# Patient Record
Sex: Male | Born: 2000 | Hispanic: Yes | Marital: Single | Smoking: Never smoker
Health system: Southern US, Community
[De-identification: ages and names within clinical notes are randomized; demographics above are authoritative.]

## PROBLEM LIST (undated history)

## (undated) DIAGNOSIS — J45909 Unspecified asthma, uncomplicated: Secondary | ICD-10-CM

---
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
## 2023-04-18 ENCOUNTER — Ambulatory Visit (INDEPENDENT_AMBULATORY_CARE_PROVIDER_SITE_OTHER): Payer: Medicaid Other | Admitting: Physician Assistant

## 2023-04-18 ENCOUNTER — Encounter: Payer: Self-pay | Admitting: Physician Assistant

## 2023-04-18 VITALS — BP 100/64 | HR 74 | Temp 98.1°F | Ht 67.0 in | Wt 129.0 lb

## 2023-04-18 DIAGNOSIS — F419 Anxiety disorder, unspecified: Secondary | ICD-10-CM | POA: Insufficient documentation

## 2023-04-18 DIAGNOSIS — Z7689 Persons encountering health services in other specified circumstances: Secondary | ICD-10-CM

## 2023-04-18 DIAGNOSIS — Z1321 Encounter for screening for nutritional disorder: Secondary | ICD-10-CM

## 2023-04-18 DIAGNOSIS — Z0000 Encounter for general adult medical examination without abnormal findings: Secondary | ICD-10-CM

## 2023-04-18 DIAGNOSIS — Z91018 Allergy to other foods: Secondary | ICD-10-CM

## 2023-04-18 DIAGNOSIS — Z1159 Encounter for screening for other viral diseases: Secondary | ICD-10-CM

## 2023-04-18 DIAGNOSIS — Z114 Encounter for screening for human immunodeficiency virus [HIV]: Secondary | ICD-10-CM

## 2023-04-18 DIAGNOSIS — F322 Major depressive disorder, single episode, severe without psychotic features: Secondary | ICD-10-CM | POA: Insufficient documentation

## 2023-04-18 DIAGNOSIS — Z23 Encounter for immunization: Secondary | ICD-10-CM | POA: Diagnosis not present

## 2023-04-18 DIAGNOSIS — J4520 Mild intermittent asthma, uncomplicated: Secondary | ICD-10-CM

## 2023-04-18 MED ORDER — HYDROXYZINE HCL 25 MG PO TABS: 25.0000 mg | ORAL_TABLET | Freq: Three times a day (TID) | ORAL | 1 refills | Status: DC | PRN

## 2023-04-18 MED ORDER — EPINEPHRINE 0.3 MG/0.3ML IJ SOAJ: 0.3000 mg | INTRAMUSCULAR | 3 refills | Status: AC | PRN

## 2023-04-18 MED ORDER — VENTOLIN HFA 108 (90 BASE) MCG/ACT IN AERS: 2.0000 | INHALATION_SPRAY | Freq: Four times a day (QID) | RESPIRATORY_TRACT | 5 refills | Status: AC | PRN

## 2023-04-18 NOTE — Patient Instructions (Signed)

## 2023-04-18 NOTE — Progress Notes (Signed)
Date:  04/18/2023   Name:  Joseph Ramos   DOB:  2001-02-08   MRN:  [id number]   Chief Complaint: Establish Care, Allergies (Wants a epi pen due to peanut allergy ), and Anxiety  HPI Joseph Ramos is a very pleasant 22 y.o. male in good health new to the clinic today to establish care and for routine physical. He has never seen a PCP as an adult. He is joined by his wife Sue Lush who was present for part of our visit today, but I requested to leave the room for discussion of depression/anxiety.   Lorence has been struggling with job transition recently. 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 Sleep is poor, averaging probably 4 hours per night. Endorses anhedonia, decreased appetite, and low libido. Previously used the basketball court as an outlet but has not been since he injured his knee 2 months ago.  Thankfully, his old boss has offered him a new job opportunity, and he is contemplating taking it but needs to decide by Monday.   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   Has asthma well-controlled with inhaler, but needs refill.   Medication list has been reviewed and updated.  Current Meds  Medication Sig   EPINEPHrine 0.3 mg/0.3 mL IJ SOAJ injection Inject 0.3 mg into the muscle as needed for anaphylaxis.   hydrOXYzine (ATARAX) 25 MG tablet Take 1 tablet (25 mg total) by mouth 3 (three) times daily as needed.   [DISCONTINUED] VENTOLIN HFA 108 (90 Base) MCG/ACT inhaler SMARTSIG:2 Puff(s) By Mouth Every 6 Hours PRN     Review of Systems  Constitutional:  Positive for appetite change and fatigue. Negative for activity change and unexpected weight change.  HENT:  Negative  for dental problem, hearing loss and trouble swallowing.   Eyes:  Negative for visual disturbance.  Respiratory:  Negative for cough, chest tightness, shortness of breath and wheezing.   Cardiovascular:  Negative for chest pain, palpitations and leg swelling.  Gastrointestinal:  Negative for abdominal distention, abdominal pain, blood in stool, constipation and diarrhea.  Endocrine: Negative for polydipsia, polyphagia and polyuria.  Genitourinary:  Negative for difficulty urinating, dysuria, enuresis, frequency, hematuria and testicular pain.  Musculoskeletal:  Negative for arthralgias, gait problem and joint swelling.  Skin:  Negative for rash and wound.  Neurological:  Negative for dizziness, syncope, weakness and headaches.  Psychiatric/Behavioral:  Positive for decreased concentration, dysphoric mood, sleep disturbance and suicidal ideas. Negative for behavioral problems and self-injury. The patient is nervous/anxious.     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 No pertinent surgical history.  Social History   Tobacco Use   Smoking status: Never   Smokeless tobacco: Never  Vaping Use   Vaping status: Never Used  Substance Use Topics   Alcohol use:  No   Drug use: Not Currently    Types: Marijuana    History reviewed. No pertinent family history.      04/18/2023    4:27 PM 04/18/2023    3:10 PM  GAD 7 : Generalized Anxiety Score  Nervous, Anxious, on Edge 3 3  Control/stop worrying 3 3  Worry too much - different things 3 3  Trouble relaxing 3 3  Restless 3 3  Easily annoyed or  irritable 3 3  Afraid - awful might happen 3 2  Total GAD 7 Score 21 20  Anxiety Difficulty Somewhat difficult Somewhat difficult       04/18/2023    3:10 PM  Depression screen PHQ 2/9  Decreased Interest 2  Down, Depressed, Hopeless 2  PHQ - 2 Score 4  Altered sleeping 2  Tired, decreased energy 2  Change in appetite 2  Feeling bad or failure about yourself  1  Trouble concentrating 2  Moving slowly or fidgety/restless 2  Suicidal thoughts 0  PHQ-9 Score 15  Difficult doing work/chores Somewhat difficult    BP Readings from Last 3 Encounters:  04/18/23 100/64  05/29/18 (!) 107/55  10/07/17 115/66    Wt Readings from Last 3 Encounters:  04/18/23 129 lb (58.5 kg)  05/29/18 124 lb 5.4 oz (56.4 kg) (16%, Z= -1.00)*  10/07/17 124 lb 5.4 oz (56.4 kg) (22%, Z= -0.77)*   * Growth percentiles are based on CDC (Boys, 2-20 Years) data.    BP 100/64   Pulse 74   Temp 98.1 F (36.7 C) (Oral)   Ht 5\' 7"  (1.702 m)   Wt 129 lb (58.5 kg)   SpO2 97%   BMI 20.20 kg/m   Physical Exam  Recent Labs     Component Value Date/Time   NA 139 05/29/2018 1417   K 3.8 05/29/2018 1417   CL 101 05/29/2018 1417   CO2 29 05/29/2018 1417   GLUCOSE 113 (H) 05/29/2018 1417   BUN 14 05/29/2018 1417   CREATININE 0.65 05/29/2018 1417   CALCIUM 9.4 05/29/2018 1417   PROT 7.0 05/29/2018 1417   ALBUMIN 4.2 05/29/2018 1417   AST 20 05/29/2018 1417   ALT 22 05/29/2018 1417   ALKPHOS 79 05/29/2018 1417   BILITOT 1.7 (H) 05/29/2018 1417   GFRNONAA NOT CALCULATED 05/29/2018 1417   GFRAA NOT CALCULATED 05/29/2018 1417    Lab Results  Component Value Date   WBC 6.6 05/29/2018   HGB 15.9 05/29/2018   HCT 44.7 05/29/2018   MCV 86.6 05/29/2018   PLT 220 05/29/2018   No results found for: "HGBA1C" No results found for: "CHOL", "HDL", "LDLCALC", "LDLDIRECT", "TRIG", "CHOLHDL" No results found for: "TSH"   Assessment and Plan:  1. Annual physical exam Physically healthy patient with no  abnormalities on exam. Encouraged healthy lifestyle including regular physical activity and consumption of whole fruits and vegetables.  Checking baseline labs today  - CBC with Differential/Platelet - Comprehensive metabolic panel - TSH  2. Encounter to establish care Happy to welcome this patient to the practice.  No records for me to review at this time.  3. Current severe episode of major depressive disorder without psychotic features without prior episode [facility]) Discussed with patient at length today.  Seems to be acutely exacerbated by recent job transition, around which most of his symptoms are focused.  Options include pharmacotherapy, psychiatry, counseling.  Patient does have a family member (other uncle) and whom he can confide and plans to speak  with him over the weekend.  Hopefully with a potential job change, his mood symptoms will greatly improve.  For now, we will trial hydroxyzine which should at the very least help with anxiety and sleep.  Offered referral to behavioral health, but deferred at this time.  Encouraged patient get back on the basketball court this weekend, but with limited intensity so as not to re-injure the knee. Suggested he just take shots on the hoop casually.   4. Anxiety Sending hydroxyzine for nightly and/or PRN use.  - hydrOXYzine (ATARAX) 25 MG tablet; Take 1 tablet (25 mg total) by mouth 3 (three) times daily as needed.  Dispense: 30 tablet; Refill: 1  5. Mild intermittent asthma without complication Refill Ventolin as below - VENTOLIN HFA 108 (90 Base) MCG/ACT inhaler; Inhale 2 puffs into the lungs every 6 (six) hours as needed for wheezing or shortness of breath.  Dispense: 1 each; Refill: 5  6. History of food anaphylaxis Refill EpiPen as below - EPINEPHrine 0.3 mg/0.3 mL IJ SOAJ injection; Inject 0.3 mg into the muscle as needed for anaphylaxis.  Dispense: 1 each; Refill: 3  7. Need for HPV vaccination Gardasil #1 administered today, will need  3 dose series - HPV 9-valent vaccine,Recombinat  8. Need for Tdap vaccination Tdap booster administered today - Tdap vaccine greater than or equal to 7yo IM  9. Encounter for vitamin deficiency screening Screen for vitamin D deficiency in the context of depression - VITAMIN D 25 Hydroxy (Vit-D Deficiency, Fractures)  10. Need for hepatitis C screening test One-time hep C screen today - Hepatitis C antibody  11. Screening for HIV (human immunodeficiency virus) One-time HIV screen today - HIV Antibody (routine testing w rflx)   Return in about 4 weeks (around 05/16/2023) for OV f/u dep/anx.    Alvester Morin, PA-C, DMSc, Nutritionist [facility] Primary Care and Sports Medicine [facility] [phone]

## 2023-04-19 LAB — CBC WITH DIFFERENTIAL/PLATELET
Basophils Absolute: 0.1 10*3/uL (ref 0.0–0.2)
Basos: 1 %
EOS (ABSOLUTE): 0.2 10*3/uL (ref 0.0–0.4)
Eos: 3 %
Hematocrit: 46.5 % (ref 37.5–51.0)
Hemoglobin: 15.8 g/dL (ref 13.0–17.7)
Immature Grans (Abs): 0 10*3/uL (ref 0.0–0.1)
Immature Granulocytes: 0 %
Lymphocytes Absolute: 1.1 10*3/uL (ref 0.7–3.1)
Lymphs: 21 %
MCH: 29.8 pg (ref 26.6–33.0)
MCHC: 34 g/dL (ref 31.5–35.7)
MCV: 88 fL (ref 79–97)
Monocytes Absolute: 0.4 10*3/uL (ref 0.1–0.9)
Monocytes: 7 %
Neutrophils Absolute: 3.6 10*3/uL (ref 1.4–7.0)
Neutrophils: 68 %
Platelets: 266 10*3/uL (ref 150–450)
RBC: 5.31 x10E6/uL (ref 4.14–5.80)
RDW: 11.7 % (ref 11.6–15.4)
WBC: 5.3 10*3/uL (ref 3.4–10.8)

## 2023-04-19 LAB — COMPREHENSIVE METABOLIC PANEL
ALT: 11 IU/L (ref 0–44)
AST: 15 IU/L (ref 0–40)
Albumin: 4.8 g/dL (ref 4.3–5.2)
Alkaline Phosphatase: 67 IU/L (ref 44–121)
BUN/Creatinine Ratio: 18 (ref 9–20)
BUN: 18 mg/dL (ref 6–20)
Bilirubin Total: 1.1 mg/dL (ref 0.0–1.2)
CO2: 22 mmol/L (ref 20–29)
Calcium: 9.7 mg/dL (ref 8.7–10.2)
Chloride: 101 mmol/L (ref 96–106)
Creatinine, Ser: 1 mg/dL (ref 0.76–1.27)
Globulin, Total: 2.2 g/dL (ref 1.5–4.5)
Glucose: 140 mg/dL — ABNORMAL HIGH (ref 70–99)
Potassium: 4.2 mmol/L (ref 3.5–5.2)
Sodium: 138 mmol/L (ref 134–144)
Total Protein: 7 g/dL (ref 6.0–8.5)
eGFR: 109 mL/min/{1.73_m2} (ref >59)

## 2023-04-19 LAB — HEPATITIS C ANTIBODY: Hep C Virus Ab: NONREACTIVE

## 2023-04-19 LAB — VITAMIN D 25 HYDROXY (VIT D DEFICIENCY, FRACTURES): Vit D, 25-Hydroxy: 25.1 ng/mL — ABNORMAL LOW (ref 30.0–100.0)

## 2023-04-19 LAB — HIV ANTIBODY (ROUTINE TESTING W REFLEX): HIV Screen 4th Generation wRfx: NONREACTIVE

## 2023-04-19 LAB — TSH: TSH: 1.25 u[IU]/mL (ref 0.450–4.500)

## 2023-04-21 ENCOUNTER — Other Ambulatory Visit: Payer: Self-pay | Admitting: Physician Assistant

## 2023-04-21 ENCOUNTER — Telehealth: Payer: Self-pay | Admitting: Physician Assistant

## 2023-04-21 DIAGNOSIS — E559 Vitamin D deficiency, unspecified: Secondary | ICD-10-CM

## 2023-04-21 MED ORDER — CHOLECALCIFEROL 1.25 MG (50000 UT) PO CAPS: 50000.0000 [IU] | ORAL_CAPSULE | ORAL | 0 refills | Status: AC

## 2023-04-21 NOTE — Telephone Encounter (Signed)
Copied from CRM [id number]. Topic: General - Inquiry >> Apr 21, 2023  9:33 AM Marlow Baars wrote: Reason for CRM: The patient called in stating he drank two sodas before he had his blood work done on Friday and doesn't know if this could be the reason for the elevated sugar levels. Please assist patient further.

## 2023-04-21 NOTE — Progress Notes (Signed)
Labs added [id number], R73.09.  KP

## 2023-04-22 LAB — SPECIMEN STATUS REPORT

## 2023-04-22 LAB — HGB A1C W/O EAG: Hgb A1c MFr Bld: 5 % (ref 4.8–5.6)

## 2023-04-22 NOTE — Telephone Encounter (Signed)
Noted we will call pt with results for his A1C when we get the results.  KP

## 2023-05-19 ENCOUNTER — Ambulatory Visit: Payer: Medicaid Other | Admitting: Physician Assistant

## 2023-06-13 ENCOUNTER — Encounter: Payer: Self-pay | Admitting: Physician Assistant

## 2023-06-13 ENCOUNTER — Ambulatory Visit (INDEPENDENT_AMBULATORY_CARE_PROVIDER_SITE_OTHER): Payer: Self-pay | Admitting: Physician Assistant

## 2023-06-13 VITALS — BP 108/68 | HR 78 | Temp 98.1°F | Ht 67.0 in | Wt 131.0 lb

## 2023-06-13 DIAGNOSIS — F411 Generalized anxiety disorder: Secondary | ICD-10-CM

## 2023-06-13 DIAGNOSIS — F322 Major depressive disorder, single episode, severe without psychotic features: Secondary | ICD-10-CM

## 2023-06-13 DIAGNOSIS — E559 Vitamin D deficiency, unspecified: Secondary | ICD-10-CM | POA: Insufficient documentation

## 2023-06-13 DIAGNOSIS — F410 Panic disorder [episodic paroxysmal anxiety] without agoraphobia: Secondary | ICD-10-CM

## 2023-06-13 DIAGNOSIS — Z23 Encounter for immunization: Secondary | ICD-10-CM

## 2023-06-13 MED ORDER — ALPRAZOLAM 0.25 MG PO TABS
0.2500 mg | ORAL_TABLET | Freq: Two times a day (BID) | ORAL | 1 refills | Status: DC | PRN
Start: 2023-06-13 — End: 2023-09-19

## 2023-06-13 NOTE — Progress Notes (Signed)
Date:  06/13/2023   Name:  Joseph Ramos   DOB:  2001-05-26   MRN:  [id number]   Chief Complaint: anxiety and depression   HPI Joseph Ramos returns for 24-month follow-up on anxiety and depression after last visit with me 04/18/2023.  He was found to have vitamin D insufficiency and has been supplementing with high-dose vitamin D supplement once weekly with good compliance.  Since her last visit he reports overall improvement in his quality of life and work situation.  He ended up taking a better job and feels like he is doing very well in his new role, optimistic about his future.  Feels stable and supported in his current romantic relationship.  He does tell me that he has been having episodes of acute anxiety, palpitations, chest tightness, and feelings of dread that typically last about 10 minutes.  These episodes seem to be entirely random, not associated with exertion, not improved with inhaler.  Last visit he was prescribed hydroxyzine for as needed use but he states this did not really help anything.   Medication list has been reviewed and updated.  Current Meds  Medication Sig   Cholecalciferol 1.25 MG (50000 UT) capsule Take 1 capsule (50,000 Units total) by mouth once a week for 12 doses.   EPINEPHrine 0.3 mg/0.3 mL IJ SOAJ injection Inject 0.3 mg into the muscle as needed for anaphylaxis.   hydrOXYzine (ATARAX) 25 MG tablet Take 1 tablet (25 mg total) by mouth 3 (three) times daily as needed.   VENTOLIN HFA 108 (90 Base) MCG/ACT inhaler Inhale 2 puffs into the lungs every 6 (six) hours as needed for wheezing or shortness of breath.     Review of Systems  Constitutional:  Positive for fatigue. Negative for fever.  Respiratory:  Positive for chest tightness. Negative for shortness of breath.   Cardiovascular:  Negative for chest pain and palpitations.  Gastrointestinal:  Negative for abdominal pain.  Psychiatric/Behavioral:  Positive for decreased concentration and  dysphoric mood. Negative for self-injury and suicidal ideas. The patient is nervous/anxious.     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 No pertinent surgical history.  Social History   Tobacco Use   Smoking status: Never   Smokeless tobacco: Never  Vaping Use   Vaping status: Never Used  Substance Use Topics   Alcohol use: No   Drug use: Not Currently    Types: Marijuana    History reviewed. No pertinent family history.      06/13/2023    1:31 PM 04/18/2023    4:27 PM 04/18/2023    3:10 PM  GAD 7 : Generalized Anxiety Score  Nervous, Anxious, on Edge 2 3 3   Control/stop worrying 2 3 3   Worry too much - different things 2 3 3   Trouble relaxing 2 3 3   Restless 2 3 3   Easily annoyed or irritable 1 3 3   Afraid - awful might happen 0 3 2  Total GAD 7 Score 11 21 20   Anxiety Difficulty Somewhat difficult Somewhat difficult Somewhat difficult       06/13/2023  1:30 PM 04/18/2023    3:10 PM  Depression screen PHQ 2/9  Decreased Interest 2 2  Down, Depressed, Hopeless 2 2  PHQ - 2 Score 4 4  Altered sleeping 2 2  Tired, decreased energy 2 2  Change in appetite 2 2  Feeling bad or failure about yourself  2 1  Trouble concentrating 2 2  Moving slowly or fidgety/restless 2 2  Suicidal thoughts 0 0  PHQ-9 Score 16 15  Difficult doing work/chores Somewhat difficult Somewhat difficult    BP Readings from Last 3 Encounters:  06/13/23 108/68  04/18/23 100/64  05/29/18 (!) 107/55    Wt Readings from Last 3 Encounters:  06/13/23 131 lb (59.4 kg)  04/18/23 129 lb (58.5 kg)   05/29/18 124 lb 5.4 oz (56.4 kg) (16%, Z= -1.00)*   * Growth percentiles are based on CDC (Boys, 2-20 Years) data.    BP 108/68   Pulse 78   Temp 98.1 F (36.7 C) (Oral)   Ht 5\' 7"  (1.702 m)   Wt 131 lb (59.4 kg)   BMI 20.52 kg/m   Physical Exam Vitals and nursing note reviewed.  Constitutional:      Appearance: Normal appearance.  Cardiovascular:     Rate and Rhythm: Normal rate and regular rhythm.     Heart sounds: No murmur heard.    No friction rub. No gallop.  Pulmonary:     Effort: Pulmonary effort is normal.     Breath sounds: Normal breath sounds.  Abdominal:     General: There is no distension.  Musculoskeletal:        General: Normal range of motion.  Skin:    General: Skin is warm and dry.  Neurological:     Mental Status: He is alert and oriented to person, place, and time.     Gait: Gait is intact.  Psychiatric:        Mood and Affect: Mood and affect normal.     Recent Labs     Component Value Date/Time   NA 138 04/18/2023 1614   K 4.2 04/18/2023 1614   CL 101 04/18/2023 1614   CO2 22 04/18/2023 1614   GLUCOSE 140 (H) 04/18/2023 1614   GLUCOSE 113 (H) 05/29/2018 1417   BUN 18 04/18/2023 1614   CREATININE 1.00 04/18/2023 1614   CALCIUM 9.7 04/18/2023 1614   PROT 7.0 04/18/2023 1614   ALBUMIN 4.8 04/18/2023 1614   AST 15 04/18/2023 1614   ALT 11 04/18/2023 1614   ALKPHOS 67 04/18/2023 1614   BILITOT 1.1 04/18/2023 1614   GFRNONAA NOT CALCULATED 05/29/2018 1417   GFRAA NOT CALCULATED 05/29/2018 1417    Lab Results  Component Value Date   WBC 5.3 04/18/2023   HGB 15.8 04/18/2023   HCT 46.5 04/18/2023   MCV 88 04/18/2023   PLT 266 04/18/2023   Lab Results  Component Value Date   HGBA1C 5.0 04/18/2023   No results found for: "CHOL", "HDL", "LDLCALC", "LDLDIRECT", "TRIG", "CHOLHDL" Lab Results  Component Value Date   TSH 1.250 04/18/2023     Assessment and Plan:  1. Generalized anxiety disorder with panic attacks Discussed  with patient it sounds like he is having panic attacks.  He would like to avoid daily medication for depression/anxiety.  Will try alprazolam for as needed use as below.  Wrote a note for work in case he has a urine drug screen.  PMP reviewed-clear - ALPRAZolam (XANAX) 0.25 MG tablet; Take 1 tablet (0.25  mg total) by mouth 2 (two) times daily as needed (for acute anxiety or panic attack).  Dispense: 20 tablet; Refill: 1  2. Current severe episode of major depressive disorder without psychotic features without prior episode [facility]) Discussed antidepressant therapy, but patient reluctant to start.  Feels well supported by partner and family.  Optimistic about job.  Would like to continue managing conservatively.  3. Vitamin D insufficiency Likely corrected on the current supplement.  Because he is self-pay, we will save him the blood draw today. You have Vitamin D deficiency, which can cause depressed mood and fatigue among other symptoms.  Continue high-dose vitamin D weekly until the prescription runs out.  Afterwards, maintain healthy vitamin D levels with either: 1. Intentional modification of diet to include more vitamin D rich foods, 2. Daily multivitamin, or 3. Daily vitamin D supplement.   I also recommend small amounts of daily exposure to sunlight, about 5-10 minutes per day.   4. Need for HPV vaccination Gardasil #2 administered today.  Plan for the third and final dose in January 2025 - HPV 9-valent vaccine,Recombinat  5. Need for influenza vaccination - Flu vaccine trivalent PF, 6mos and older(Flulaval,Afluria,Fluarix,Fluzone)   Follow-up 10/20/2023 or after for OV chronic conditions with Gardasil No. 3.  Return sooner if he obtains health insurance during open enrollment.   Alvester Morin, PA-C, DMSc, Nutritionist [facility] Primary Care and Sports Medicine [facility] [phone]

## 2023-06-13 NOTE — Patient Instructions (Signed)
-  It was a pleasure to see you today! Please review your visit summary for helpful information -I would encourage you to follow your care via MyChart where you can access lab results, notes, messages, and more -If you feel that we did a nice job today, please complete your after-visit survey and leave [place] a Google review! Your CMA today was Mariann Barter and your provider was Alvester Morin, PA-C, DMSc -Please return for follow-up in about 4 months, sooner if you get health insurance.

## 2023-09-18 ENCOUNTER — Other Ambulatory Visit: Payer: Self-pay | Admitting: Physician Assistant

## 2023-09-18 DIAGNOSIS — F410 Panic disorder [episodic paroxysmal anxiety] without agoraphobia: Secondary | ICD-10-CM

## 2023-09-18 NOTE — Telephone Encounter (Signed)
Please review.  KP

## 2023-09-19 ENCOUNTER — Other Ambulatory Visit: Payer: Self-pay | Admitting: Physician Assistant

## 2023-09-19 DIAGNOSIS — F419 Anxiety disorder, unspecified: Secondary | ICD-10-CM

## 2023-09-19 DIAGNOSIS — F410 Panic disorder [episodic paroxysmal anxiety] without agoraphobia: Secondary | ICD-10-CM

## 2023-09-19 MED ORDER — ALPRAZOLAM 0.25 MG PO TABS: 0.2500 mg | ORAL_TABLET | Freq: Two times a day (BID) | ORAL | 0 refills | Status: AC | PRN

## 2023-09-19 MED ORDER — HYDROXYZINE HCL 25 MG PO TABS: 25.0000 mg | ORAL_TABLET | Freq: Three times a day (TID) | ORAL | 1 refills | Status: AC | PRN

## 2023-10-13 ENCOUNTER — Ambulatory Visit: Payer: Self-pay | Admitting: Physician Assistant
